# Patient Record
Sex: Male | Born: 1970
Health system: Southern US, Community
[De-identification: ages and names within clinical notes are randomized; demographics above are authoritative.]

## PROBLEM LIST (undated history)

## (undated) DIAGNOSIS — M25559 Pain in unspecified hip: Secondary | ICD-10-CM

---

## 2005-07-03 ENCOUNTER — Emergency Department (HOSPITAL_COMMUNITY): Admission: EM | Admit: 2005-07-03 | Discharge: 2005-07-03 | Payer: Self-pay | Admitting: *Deleted

## 2006-10-08 ENCOUNTER — Ambulatory Visit: Payer: Self-pay | Admitting: Family Medicine

## 2007-05-09 ENCOUNTER — Emergency Department (HOSPITAL_COMMUNITY): Admission: EM | Admit: 2007-05-09 | Discharge: 2007-05-09 | Payer: Self-pay | Admitting: Emergency Medicine

## 2010-11-21 ENCOUNTER — Emergency Department (HOSPITAL_COMMUNITY)
Admission: EM | Admit: 2010-11-21 | Discharge: 2010-11-21 | Disposition: A | Discharge status: Home / Self Care | Payer: Self-pay | Attending: Emergency Medicine | Admitting: Emergency Medicine

## 2010-11-21 DIAGNOSIS — IMO0002 Reserved for concepts with insufficient information to code with codable children: Secondary | ICD-10-CM | POA: Insufficient documentation

## 2011-10-30 ENCOUNTER — Emergency Department (HOSPITAL_COMMUNITY)
Admission: EM | Admit: 2011-10-30 | Discharge: 2011-10-30 | Disposition: A | Discharge status: Home / Self Care | Payer: Self-pay | Attending: Emergency Medicine | Admitting: Emergency Medicine

## 2011-10-30 ENCOUNTER — Emergency Department (HOSPITAL_COMMUNITY): Payer: Self-pay

## 2011-10-30 ENCOUNTER — Encounter (HOSPITAL_COMMUNITY): Payer: Self-pay | Admitting: *Deleted

## 2011-10-30 DIAGNOSIS — R091 Pleurisy: Secondary | ICD-10-CM | POA: Insufficient documentation

## 2011-10-30 DIAGNOSIS — R0602 Shortness of breath: Secondary | ICD-10-CM | POA: Insufficient documentation

## 2011-10-30 DIAGNOSIS — R079 Chest pain, unspecified: Secondary | ICD-10-CM | POA: Insufficient documentation

## 2011-10-30 LAB — COMPREHENSIVE METABOLIC PANEL
ALT: 15 U/L (ref 0–53)
AST: 18 U/L (ref 0–37)
Albumin: 4.1 g/dL (ref 3.5–5.2)
CO2: 22 mEq/L (ref 19–32)
Calcium: 9.7 mg/dL (ref 8.4–10.5)
Creatinine, Ser: 0.93 mg/dL (ref 0.50–1.35)
Sodium: 135 mEq/L (ref 135–145)
Total Protein: 8 g/dL (ref 6.0–8.3)

## 2011-10-30 LAB — CBC WITH DIFFERENTIAL/PLATELET
Basophils Absolute: 0 10*3/uL (ref 0.0–0.1)
Basophils Relative: 0 % (ref 0–1)
Eosinophils Absolute: 0.1 10*3/uL (ref 0.0–0.7)
Eosinophils Relative: 1 % (ref 0–5)
Lymphocytes Relative: 26 % (ref 12–46)
MCHC: 35 g/dL (ref 30.0–36.0)
MCV: 82.1 fL (ref 78.0–100.0)
Platelets: 346 10*3/uL (ref 150–400)
RDW: 12.9 % (ref 11.5–15.5)
WBC: 8.2 10*3/uL (ref 4.0–10.5)

## 2011-10-30 LAB — URINALYSIS, ROUTINE W REFLEX MICROSCOPIC
Nitrite: NEGATIVE
Protein, ur: NEGATIVE mg/dL
Specific Gravity, Urine: 1.029 (ref 1.005–1.030)
Urobilinogen, UA: 1 mg/dL (ref 0.0–1.0)

## 2011-10-30 LAB — CARDIAC PANEL(CRET KIN+CKTOT+MB+TROPI)
CK, MB: 1.5 ng/mL (ref 0.3–4.0)
Relative Index: INVALID (ref 0.0–2.5)
Troponin I: <0.3 ng/mL (ref <0.30)

## 2011-10-30 LAB — URINE MICROSCOPIC-ADD ON

## 2011-10-30 MED ORDER — LORAZEPAM 1 MG PO TABS
1.0000 mg | ORAL_TABLET | Freq: Once | ORAL | Status: AC
  Administered 2011-10-30: 1 mg via ORAL
  Filled 2011-10-30: qty 1

## 2011-10-30 MED ORDER — OXYCODONE-ACETAMINOPHEN 5-325 MG PO TABS
2.0000 | ORAL_TABLET | Freq: Once | ORAL | Status: AC
  Administered 2011-10-30: 2 via ORAL

## 2011-10-30 MED ORDER — HYDROMORPHONE HCL PF 1 MG/ML IJ SOLN
1.0000 mg | Freq: Once | INTRAMUSCULAR | Status: AC
  Administered 2011-10-30: 1 mg via INTRAVENOUS
  Filled 2011-10-30: qty 1

## 2011-10-30 MED ORDER — OXYCODONE-ACETAMINOPHEN 5-325 MG PO TABS
ORAL_TABLET | ORAL | Status: AC
  Filled 2011-10-30: qty 2

## 2011-10-30 MED ORDER — IBUPROFEN 800 MG PO TABS: 800.0000 mg | ORAL_TABLET | Freq: Three times a day (TID) | ORAL | Status: AC

## 2011-10-30 MED ORDER — ONDANSETRON HCL 4 MG/2ML IJ SOLN
4.0000 mg | Freq: Once | INTRAMUSCULAR | Status: AC
  Administered 2011-10-30: 4 mg via INTRAVENOUS
  Filled 2011-10-30: qty 2

## 2011-10-30 MED ORDER — IOHEXOL 350 MG/ML SOLN
80.0000 mL | Freq: Once | INTRAVENOUS | Status: AC | PRN
  Administered 2011-10-30: 80 mL via INTRAVENOUS

## 2011-10-30 MED ORDER — SODIUM CHLORIDE 0.9 % IV SOLN
Freq: Once | INTRAVENOUS | Status: AC
  Administered 2011-10-30: 16:00:00 via INTRAVENOUS

## 2011-10-30 MED ORDER — HYDROCODONE-ACETAMINOPHEN 5-325 MG PO TABS: 2.0000 | ORAL_TABLET | ORAL | Status: AC | PRN

## 2011-10-30 NOTE — ED Notes (Signed)
Pt. Is unable to use the restroom at time, but there is a urinal at the bedside.

## 2011-10-30 NOTE — ED Notes (Signed)
One pt belonging bag locked in room #4 reg ED

## 2011-10-30 NOTE — ED Notes (Signed)
Pt states "been having pain in my left side for the past 8-9 years, but yesterday the pain got worse and has not gone aay, is also hurting in my back, hurts worth with breathing"

## 2011-10-30 NOTE — ED Notes (Signed)
Pt to ct 

## 2011-10-30 NOTE — ED Provider Notes (Signed)
History     CSN: [id number]  Arrival date & time 10/30/11  1450   First MD Initiated Contact with Patient 10/30/11 1533      Chief Complaint  Patient presents with  . Chest Pain    left rib pain    (Consider location/radiation/quality/duration/timing/severity/associated sxs/prior treatment) HPI Comments: Patient's intermittent left-sided rib pain for the past several years much worse in the past 2 days has been constant and worse with deep breathing. And some chest raise the left side and to his left mid back. Nothing makes it better breathing makes it worse. Denies any cough, fever, chills, abdominal pain nausea vomiting. No history of blood clots. No leg pain or swelling.  Associated with SOB.  The history is provided by the patient.    History reviewed. No pertinent past medical history.  History reviewed. No pertinent past surgical history.  History reviewed. No pertinent family history.  History  Substance Use Topics  . Smoking status: Never Smoker   . Smokeless tobacco: Not on file  . Alcohol Use: Yes     ocassionally      Review of Systems  Constitutional: Negative for fever, activity change and appetite change.  HENT: Negative for congestion and rhinorrhea.   Respiratory: Positive for shortness of breath. Negative for cough.   Cardiovascular: Positive for chest pain.  Gastrointestinal: Negative for nausea, vomiting and abdominal pain.  Genitourinary: Negative for dysuria and hematuria.  Musculoskeletal: Negative for back pain.  Skin: Negative for rash.  Neurological: Negative for dizziness and headaches.    Allergies  Penicillins  Home Medications  No current outpatient prescriptions on file.  BP 100/67  Pulse 67  Temp 98.5 F (36.9 C) (Oral)  Resp 16  Wt 167 lb (75.751 kg)  SpO2 100%  Physical Exam  Constitutional: He is oriented to person, place, and time. He appears well-developed and well-nourished. He appears distressed.       uncomfortable   HENT:  Head: Normocephalic and atraumatic.  Mouth/Throat: Oropharynx is clear and moist. No oropharyngeal exudate.  Eyes: Conjunctivae and EOM are normal. Pupils are equal, round, and reactive to light.  Neck: Normal range of motion. Neck supple.  Cardiovascular: Normal rate, regular rhythm and normal heart sounds.   No murmur heard. Pulmonary/Chest: Effort normal and breath sounds normal. No respiratory distress. He exhibits tenderness.       TTP left sided ribs.  Abdominal: Soft. There is no tenderness. There is no rebound and no guarding.  Musculoskeletal: Normal range of motion. He exhibits tenderness. He exhibits no edema.       TTP paraspinal thoracic pain. No midline back pain.  Neurological: He is alert and oriented to person, place, and time. No cranial nerve deficit.  Skin: Skin is warm.    ED Course  Procedures (including critical care time)  Labs Reviewed  CBC WITH DIFFERENTIAL - Abnormal; Notable for the following:    HCT 38.9 (*)     All other components within normal limits  URINALYSIS, ROUTINE W REFLEX MICROSCOPIC - Abnormal; Notable for the following:    Glucose, UA 100 (*)     Leukocytes, UA TRACE (*)     All other components within normal limits  COMPREHENSIVE METABOLIC PANEL  CARDIAC PANEL(CRET KIN+CKTOT+MB+TROPI)  URINE MICROSCOPIC-ADD ON   Dg Chest 2 View  10/30/2011  *RADIOLOGY REPORT*  Clinical Data: Left chest pain radiating back.  Shortness of breath.  CHEST - 2 VIEW  Comparison: None.  Findings: Heart size and mediastinal  contours are normal.  Both lungs are clear.  No evidence of pneumothorax or pleural effusion. No mass or lymphadenopathy identified.  IMPRESSION: No active disease.  Original Report Authenticated By: Danae Orleans, M.D.   Ct Angio Chest W/cm &/or Wo Cm  10/30/2011  *RADIOLOGY REPORT*  Clinical Data: Chest pain, short of breath, concern pulmonary embolism  CT ANGIOGRAPHY CHEST  Technique:  Multidetector CT imaging of the chest using the  standard protocol during bolus administration of intravenous contrast. Multiplanar reconstructed images including MIPs were obtained and reviewed to evaluate the vascular anatomy.  Contrast: 80mL OMNIPAQUE IOHEXOL 350 MG/ML SOLN  Comparison: None.  Findings: There is no filling defects within the pulmonary arteries to suggest acute pulmonary embolism.  No acute findings of the aorta or great vessels.  No pericardial fluid.  Esophagus is normal.  No mediastinal lymphadenopathy.  Review of the lung windows demonstrate no pleural fluid or pneumothorax.  No pulmonary edema.  Airways are normal.  There is a small dependent left pleural effusion.  Limited view of the upper abdomen is unremarkable.  Limited view of the skeleton is unremarkable.  IMPRESSION:  1.  No evidence of acute pulmonary embolism. 2.  Small left effusion.  Original Report Authenticated By: Genevive Bi, M.D.     No diagnosis found.    MDM  Pleuritic left-sided chest pain for the past several years but worse in the past few days. Worse with deep breathing. No anterior chest pain, cough or fever.  X-ray negative. CT negative for pulmonary embolism. Bedside ultrasound negative for pericardial effusion. Patient does have a maculopapular rash on his torso but he's had for several months but no evidence of herpetic vesicles.  No desaturation with ambulation. We'll treat for pleurisy. Return precautions discussed including development of herpetic rash   Date: 10/30/2011  Rate: 55  Rhythm: normal sinus rhythm  QRS Axis: normal  Intervals: normal  ST/T Wave abnormalities: normal  Conduction Disutrbances:none  Narrative Interpretation:   Old EKG Reviewed: none available   EMERGENCY DEPARTMENT [place] CARDIAC EXAM "Study: Limited Ultrasound of the heart and pericardium"  INDICATIONS:Dyspnea Multiple views of the heart and pericardium are obtained with a multi-frequency probe.  PERFORMED [id number]  IMAGES ARCHIVED?:  No  FINDINGS: No pericardial effusion, Normal contractility and Tamponade physiology absent  LIMITATIONS:  Beside techniqu  VIEWS USED: Subcostal 4 chamber and Apical 4 chamber   INTERPRETATION: Cardiac activity present, Pericardial effusioin absent, Cardiac tamponade absent and Normal contractility      Glynn Octave, MD 10/30/11 1939

## 2011-11-05 ENCOUNTER — Ambulatory Visit: Payer: Self-pay | Admitting: Medical

## 2012-09-29 ENCOUNTER — Encounter (HOSPITAL_COMMUNITY): Payer: Self-pay | Admitting: Family Medicine

## 2012-09-29 ENCOUNTER — Emergency Department (HOSPITAL_COMMUNITY): Payer: Self-pay

## 2012-09-29 ENCOUNTER — Emergency Department (HOSPITAL_COMMUNITY)
Admission: EM | Admit: 2012-09-29 | Discharge: 2012-09-29 | Disposition: A | Discharge status: Home / Self Care | Payer: Self-pay | Attending: Emergency Medicine | Admitting: Emergency Medicine

## 2012-09-29 DIAGNOSIS — M542 Cervicalgia: Secondary | ICD-10-CM

## 2012-09-29 DIAGNOSIS — Y929 Unspecified place or not applicable: Secondary | ICD-10-CM | POA: Insufficient documentation

## 2012-09-29 DIAGNOSIS — Y9389 Activity, other specified: Secondary | ICD-10-CM | POA: Insufficient documentation

## 2012-09-29 DIAGNOSIS — S199XXA Unspecified injury of neck, initial encounter: Secondary | ICD-10-CM | POA: Insufficient documentation

## 2012-09-29 DIAGNOSIS — S0993XA Unspecified injury of face, initial encounter: Secondary | ICD-10-CM | POA: Insufficient documentation

## 2012-09-29 DIAGNOSIS — Z880 Allergy status to penicillin: Secondary | ICD-10-CM | POA: Insufficient documentation

## 2012-09-29 DIAGNOSIS — R369 Urethral discharge, unspecified: Secondary | ICD-10-CM | POA: Insufficient documentation

## 2012-09-29 DIAGNOSIS — N489 Disorder of penis, unspecified: Secondary | ICD-10-CM | POA: Insufficient documentation

## 2012-09-29 DIAGNOSIS — X503XXA Overexertion from repetitive movements, initial encounter: Secondary | ICD-10-CM | POA: Insufficient documentation

## 2012-09-29 DIAGNOSIS — Z87828 Personal history of other (healed) physical injury and trauma: Secondary | ICD-10-CM | POA: Insufficient documentation

## 2012-09-29 MED ORDER — CYCLOBENZAPRINE HCL 10 MG PO TABS: ORAL_TABLET | ORAL | Status: DC

## 2012-09-29 MED ORDER — HYDROCODONE-ACETAMINOPHEN 5-325 MG PO TABS
1.0000 | ORAL_TABLET | Freq: Once | ORAL | Status: AC
  Administered 2012-09-29: 1 via ORAL
  Filled 2012-09-29: qty 1

## 2012-09-29 MED ORDER — IBUPROFEN 800 MG PO TABS: 800.0000 mg | ORAL_TABLET | Freq: Three times a day (TID) | ORAL | Status: DC

## 2012-09-29 NOTE — ED Notes (Signed)
sts last night he was getting off the couch and heard a pop in his neck and now pain and pressure in neck. sts hurts in the middle of neck.

## 2012-09-29 NOTE — ED Notes (Signed)
Cervical collar applied in triage.
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
## 2012-10-02 NOTE — ED Provider Notes (Signed)
History/physical exam/procedure(s) were performed by non-physician practitioner and as supervising physician I was immediately available for consultation/collaboration. I have reviewed all notes and am in agreement with care and plan.   Hilario Quarry, MD 10/02/12 1755

## 2012-10-19 ENCOUNTER — Encounter (HOSPITAL_COMMUNITY): Payer: Self-pay | Admitting: Emergency Medicine

## 2012-10-19 ENCOUNTER — Emergency Department (HOSPITAL_COMMUNITY)
Admission: EM | Admit: 2012-10-19 | Discharge: 2012-10-19 | Disposition: A | Discharge status: Home / Self Care | Payer: Self-pay | Attending: Emergency Medicine | Admitting: Emergency Medicine

## 2012-10-19 DIAGNOSIS — IMO0001 Reserved for inherently not codable concepts without codable children: Secondary | ICD-10-CM | POA: Insufficient documentation

## 2012-10-19 DIAGNOSIS — Z880 Allergy status to penicillin: Secondary | ICD-10-CM | POA: Insufficient documentation

## 2012-10-19 DIAGNOSIS — M79609 Pain in unspecified limb: Secondary | ICD-10-CM | POA: Insufficient documentation

## 2012-10-19 DIAGNOSIS — R6883 Chills (without fever): Secondary | ICD-10-CM | POA: Insufficient documentation

## 2012-10-19 DIAGNOSIS — M79601 Pain in right arm: Secondary | ICD-10-CM

## 2012-10-19 MED ORDER — METHOCARBAMOL 500 MG PO TABS
500.0000 mg | ORAL_TABLET | Freq: Once | ORAL | Status: AC
  Administered 2012-10-19: 500 mg via ORAL
  Filled 2012-10-19: qty 1

## 2012-10-19 MED ORDER — PREDNISONE 20 MG PO TABS: ORAL_TABLET | ORAL | Status: DC

## 2012-10-19 MED ORDER — HYDROCODONE-ACETAMINOPHEN 5-325 MG PO TABS
2.0000 | ORAL_TABLET | Freq: Once | ORAL | Status: AC
  Administered 2012-10-19: 2 via ORAL
  Filled 2012-10-19: qty 2

## 2012-10-19 MED ORDER — ONDANSETRON 8 MG PO TBDP
8.0000 mg | ORAL_TABLET | Freq: Once | ORAL | Status: AC
  Administered 2012-10-19: 8 mg via ORAL
  Filled 2012-10-19: qty 1

## 2012-10-19 MED ORDER — PROMETHAZINE HCL 25 MG PO TABS: 25.0000 mg | ORAL_TABLET | Freq: Four times a day (QID) | ORAL | Status: DC | PRN

## 2012-10-19 MED ORDER — HYDROCODONE-ACETAMINOPHEN 5-325 MG PO TABS: 2.0000 | ORAL_TABLET | ORAL | Status: DC | PRN

## 2012-10-19 NOTE — ED Notes (Addendum)
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 Pt has mild swelling to R forearm. Pt denies injury Pt is A&O and in NAD

## 2012-10-19 NOTE — ED Provider Notes (Signed)
History    This chart was scribed for non-physician practitioner, Mora Bellman, PA-C, working with Hilario Quarry, MD by Melba Coon, ED Scribe. This patient was seen in room WTR5/WTR5 and the patient's care was started at 4:53PM.  CSN: [id number] Arrival date & time 10/19/12  1555  None    Chief Complaint  Patient presents with  . Arm Pain   (Consider location/radiation/quality/duration/timing/severity/associated sxs/prior Treatment) The history is provided by the patient. No language interpreter was used.   HPI Comments: Jeffrey Vasquez is a 42 y.o. male who presents to the Emergency Department complaining of constant, moderate to severe, stabbing, shooting, right forearm pain with a gradual onset yesterday. 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 He feels he may have overworked his arm. He reports today at work, he could not grab things due to the pain and reports that the pain has gotten progressively worse. Moving and twisting the arm aggravates the pain. He also reports a "cracking" sensation in his arm. Goody powder has not alleviated the pain. He denies taking any other pain medications. No known allergies to pain medications. No other pertinent medical symptoms.  History reviewed. No pertinent past medical history. History reviewed. No pertinent past surgical history. No family history on file. History  Substance Use Topics  . Smoking status: Never Smoker   . Smokeless tobacco: Not on file  . Alcohol Use: Yes     Comment: ocassionally    Review of Systems  Constitutional: Positive for chills. Negative for fever, appetite change and fatigue.  HENT: Negative for congestion, sinus pressure and ear discharge.   Eyes: Negative for discharge.  Respiratory: Negative for cough.   Cardiovascular: Negative for chest pain.  Gastrointestinal: Negative for nausea, vomiting, abdominal pain and diarrhea.  Genitourinary: Negative for  frequency and hematuria.  Musculoskeletal: Positive for myalgias. Negative for back pain.  Skin: Negative for rash.  Neurological: Negative for seizures and headaches.  Psychiatric/Behavioral: Negative for hallucinations.  All other systems reviewed and are negative.    Allergies  Penicillins  Home Medications   Current Outpatient Rx  Name  Route  Sig  Dispense  Refill  . ibuprofen (ADVIL,MOTRIN) 800 MG tablet   Oral   Take 1 tablet (800 mg total) by mouth 3 (three) times daily.   21 tablet   0    BP 107/74  Pulse 72  Temp(Src) 97.7 F (36.5 C) (Oral)  SpO2 100% Physical Exam  Nursing note and vitals reviewed. Constitutional: He is oriented to person, place, and time. He appears well-developed and well-nourished. No distress.  HENT:  Head: Normocephalic and atraumatic.  Right Ear: External ear normal.  Left Ear: External ear normal.  Nose: Nose normal.  Eyes: Conjunctivae are normal.  Neck: Normal range of motion. No tracheal deviation present.  Cardiovascular: Normal rate, regular rhythm, normal heart sounds and intact distal pulses.   Pulmonary/Chest: Effort normal and breath sounds normal. No stridor.  Abdominal: Soft. He exhibits no distension. There is no tenderness.  Musculoskeletal: Normal range of motion. He exhibits tenderness.  TTP posterolateral aspect of the right distal forearm with limited ROM secondary to pain. Compartment is soft.  Neurological: He is alert and oriented to person, place, and time.  Skin: Skin is warm and dry. He is not diaphoretic.  CR < 3 seconds. Has multiple tattoos.  Psychiatric: He has a normal mood and affect. His behavior is normal.    ED Course  Procedures (including critical care time)  COORDINATION OF CARE:  4:58PM - Pain medication and muscle relaxers will be ordered for [employer]. Thumb spica splint will be given. Work note will be given.   Labs Reviewed - No data to display  No results found.  1. Arm pain,  right     MDM  Patient presents with an overuse injury - no trauma. ROM limited due to pain. Compartment soft, neurovascularly intact. He was given a brace for comfort. Work note given. Rest, ice, compression, NSAIDs. Small rx for norco and prednisone taper. Ortho follow up if no improvement in symptoms. Return instructions given. Vital signs stable for discharge. Patient / Family / Caregiver informed of clinical course, understand medical decision-making process, and agree with plan.   I personally performed the services described in this documentation, which was scribed in my presence. The recorded information has been reviewed and is accurate.     Mora Bellman, PA-C 10/19/12 1754

## 2012-10-20 NOTE — ED Provider Notes (Signed)
History/physical exam/procedure(s) were performed by non-physician practitioner and as supervising physician I was immediately available for consultation/collaboration. I have reviewed all notes and am in agreement with care and plan.   Hilario Quarry, MD 10/20/12 [phone]

## 2012-12-29 ENCOUNTER — Encounter (HOSPITAL_COMMUNITY): Payer: Self-pay | Admitting: Emergency Medicine

## 2012-12-29 ENCOUNTER — Emergency Department (HOSPITAL_COMMUNITY)
Admission: EM | Admit: 2012-12-29 | Discharge: 2012-12-29 | Disposition: A | Discharge status: Home / Self Care | Payer: Self-pay | Attending: Emergency Medicine | Admitting: Emergency Medicine

## 2012-12-29 DIAGNOSIS — L02412 Cutaneous abscess of left axilla: Secondary | ICD-10-CM

## 2012-12-29 DIAGNOSIS — Z880 Allergy status to penicillin: Secondary | ICD-10-CM | POA: Insufficient documentation

## 2012-12-29 DIAGNOSIS — IMO0002 Reserved for concepts with insufficient information to code with codable children: Secondary | ICD-10-CM | POA: Insufficient documentation

## 2012-12-29 MED ORDER — SULFAMETHOXAZOLE-TRIMETHOPRIM 800-160 MG PO TABS: 1.0000 | ORAL_TABLET | Freq: Two times a day (BID) | ORAL | Status: DC

## 2012-12-29 MED ORDER — HYDROCODONE-ACETAMINOPHEN 5-325 MG PO TABS
1.0000 | ORAL_TABLET | Freq: Once | ORAL | Status: AC
  Administered 2012-12-29: 1 via ORAL
  Filled 2012-12-29: qty 1

## 2012-12-29 MED ORDER — HYDROCODONE-ACETAMINOPHEN 5-325 MG PO TABS: 1.0000 | ORAL_TABLET | ORAL | Status: DC | PRN

## 2012-12-29 NOTE — ED Notes (Signed)
Pt reports an abscess in the left axilla. Pt reports similar problem one year ago. Pt is A/O x4 and in NAD.

## 2012-12-29 NOTE — ED Notes (Signed)
Pt has a ride home.  

## 2012-12-29 NOTE — ED Provider Notes (Signed)
CSN: [id number]     Arrival date & time 12/29/12  1849 History   This chart was scribed for non-physician practitioner Ivonne Andrew, PA-C, working with Nelia Shi, MD by Dorothey Baseman, ED Scribe. This patient was seen in room WTR9/WTR9 and the patient's care was started at 8:11 PM.    Chief Complaint  Patient presents with  . Abscess    The history is provided by the patient. No language interpreter was used.   HPI Comments: Jeffrey Vasquez is a 42 y.o. male who presents to the Emergency Department complaining of an abscess to his left axilla onset 3 days ago with associated redness, swelling, and pain that is exacerbated with movement of his left arm. He states he was treated for an abscess in the same location previously, but that it has returned. Patient states he has not tried any treatments at home. He denies any fever, chills, or other symptoms. He denies any pertinent medical history or medication use.   History reviewed. No pertinent past medical history. History reviewed. No pertinent past surgical history. No family history on file. History  Substance Use Topics  . Smoking status: Never Smoker   . Smokeless tobacco: Not on file  . Alcohol Use: Yes     Comment: ocassionally    Review of Systems  Constitutional: Negative for fever and chills.  All other systems reviewed and are negative.    Allergies  Penicillins  Home Medications  No current outpatient prescriptions on file.  Triage Vitals: BP 113/71  Pulse 78  Temp(Src) 98.2 F (36.8 C) (Oral)  Resp 18  SpO2 99%  Physical Exam  Nursing note and vitals reviewed. Constitutional: He is oriented to person, place, and time. He appears well-developed and well-nourished. No distress.  HENT:  Head: Normocephalic and atraumatic.  Eyes: Conjunctivae are normal.  Neck: Normal range of motion. Neck supple.  Cardiovascular: Normal rate, regular rhythm and normal heart sounds.   Pulmonary/Chest: Effort normal and breath  sounds normal.  Musculoskeletal: Normal range of motion.  Neurological: He is alert and oriented to person, place, and time.  Skin: Skin is warm and dry. There is erythema.  2 cm irregular nodule to the left axilla with mild, surrounding erythema and tenderness.  Psychiatric: He has a normal mood and affect. His behavior is normal.    ED Course  Procedures Medications  HYDROcodone-acetaminophen (NORCO/VICODIN) 5-325 MG per tablet 1 tablet (1 tablet Oral Given 12/29/12 2035)    DIAGNOSTIC STUDIES: Oxygen Saturation is 99% on room air, normal by my interpretation.    COORDINATION OF CARE: 8:15PM- Discussed that the nodule is due to a bacterial infection and will discharge the patient with Septra to take every 12 hours. Will discharge patient with Norco to take every 4 hours or PRN for pain management. Will perform an incision and drainage of the abscess. Advised patient to apply warm compresses if there are any future episodes. Discussed treatment plan with patient at bedside and patient verbalized agreement.   INCISION AND DRAINAGE Performed by: Ivonne Andrew, PA-C Consent: Verbal consent obtained. Risks and benefits: risks, benefits and alternatives were discussed Type: abscess Body area: left axilla Incision was made with a scalpel. Local anesthetic: lidocaine 2% without epinephrine Anesthetic total: 2 ml Complexity: simple Blunt dissection to break up loculations Drainage: purulent Drainage amount: moderate Patient tolerance: Patient tolerated the procedure well with no immediate complications.     MDM   1. Abscess of left axilla  8:15 PM patient seen and evaluated. Patient appears well in no acute distress. Does not appear severely ill or toxic. Simple abscess of left axilla. I&D performed. Home Care instructions provided.    I personally performed the services described in this documentation, which was scribed in my presence. The recorded information has been  reviewed and is accurate.      Angus Seller, PA-C 12/29/12 2105

## 2012-12-30 NOTE — ED Provider Notes (Signed)
Medical screening examination/treatment/procedure(s) were performed by non-physician practitioner and as supervising physician I was immediately available for consultation/collaboration.    Jeffrey Bodkin L Hosea Han[place]alt, MD 12/30/12 1502 

## 2013-08-10 ENCOUNTER — Encounter (HOSPITAL_COMMUNITY): Payer: Self-pay | Admitting: Emergency Medicine

## 2013-08-10 ENCOUNTER — Emergency Department (HOSPITAL_COMMUNITY)
Admission: EM | Admit: 2013-08-10 | Discharge: 2013-08-11 | Disposition: A | Discharge status: Home / Self Care | Payer: Self-pay | Attending: Emergency Medicine | Admitting: Emergency Medicine

## 2013-08-10 DIAGNOSIS — Q6589 Other specified congenital deformities of hip: Secondary | ICD-10-CM | POA: Insufficient documentation

## 2013-08-10 DIAGNOSIS — M1631 Unilateral osteoarthritis resulting from hip dysplasia, right hip: Secondary | ICD-10-CM

## 2013-08-10 DIAGNOSIS — M1990 Unspecified osteoarthritis, unspecified site: Secondary | ICD-10-CM | POA: Insufficient documentation

## 2013-08-10 DIAGNOSIS — M549 Dorsalgia, unspecified: Secondary | ICD-10-CM | POA: Insufficient documentation

## 2013-08-10 DIAGNOSIS — Z880 Allergy status to penicillin: Secondary | ICD-10-CM | POA: Insufficient documentation

## 2013-08-10 NOTE — ED Notes (Signed)
Pt states he has been having intermittent groin pain for years that has gotten worse tonight.  Pt states it hurts to walk and is more in his hip flexor area than genitals

## 2013-08-11 ENCOUNTER — Emergency Department (HOSPITAL_COMMUNITY): Payer: Self-pay

## 2013-08-11 MED ORDER — MELOXICAM 7.5 MG PO TABS: 15.0000 mg | ORAL_TABLET | Freq: Every day | ORAL | Status: DC

## 2013-08-11 MED ORDER — OXYCODONE-ACETAMINOPHEN 5-325 MG PO TABS: 1.0000 | ORAL_TABLET | Freq: Four times a day (QID) | ORAL | Status: DC | PRN

## 2013-08-11 MED ORDER — GUAIFENESIN 100 MG/5ML PO SYRP: 100.0000 mg | ORAL_SOLUTION | ORAL | Status: DC | PRN

## 2013-08-11 MED ORDER — OXYCODONE-ACETAMINOPHEN 5-325 MG PO TABS
2.0000 | ORAL_TABLET | Freq: Once | ORAL | Status: AC
  Administered 2013-08-11: 2 via ORAL
  Filled 2013-08-11: qty 2

## 2013-08-11 MED ORDER — METHOCARBAMOL 500 MG PO TABS: 500.0000 mg | ORAL_TABLET | Freq: Two times a day (BID) | ORAL | Status: DC | PRN

## 2013-08-11 NOTE — ED Provider Notes (Signed)
CSN: [id number]     Arrival date & time 08/10/13  2233 History  This chart was scribed for non-physician practitioner Junius FinnerErin O'Malley, PA-C working with Rolland PorterMark Pradeep, MD by Joaquin MusicKristina Sanchez-Matthews, ED Scribe. This patient was seen in room TR08C/TR08C and the patient's care was started at 12:02 AM .   Chief Complaint  Patient presents with  . Groin Pain   The history is provided by the patient. No language interpreter was used.   HPI Comments: Jeffrey Vasquez is a 43 y.o. male who presents to the Emergency Department complaining of intermittent groin pain that radiates to R hip that began "a while ago" but has worsened today. Pt states he is unable to walk due to his pain and describes the pain as sharp and constant, 8/10, worse with walking and certain movements including laying on his R side. He states when he feels constipated, he has an increased amount of gas and is unsure if this may be the cause of his pain. He states he has taken a Percocet "the other day" but denies any relief. 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 He denies back pain, swelling to testicle, dysuria, and recent traumas.  History reviewed. No pertinent past medical history. History reviewed. No pertinent past surgical history. No family history on file. History  Substance Use Topics  . Smoking status: Never Smoker   . Smokeless tobacco: Not on file  . Alcohol Use: Yes     Comment: ocassionally    Review of Systems  Genitourinary: Negative for dysuria, flank pain, scrotal swelling and testicular pain.  Musculoskeletal: Positive for back pain and myalgias.  Skin: Negative for wound.   Allergies  Penicillins  Home Medications   Prior to Admission medications   Not on File   BP 113/88  Pulse 66  Temp(Src) 98.2 F (36.8 C) (Oral)  Resp 18  Ht 5\' 9"  (1.753 m)  Wt 155 lb (70.308 kg)  BMI 22.88 kg/m2  SpO2 98%  Physical Exam  Nursing note and vitals reviewed. Constitutional: He is  oriented to person, place, and time. He appears well-developed and well-nourished.  HENT:  Head: Normocephalic and atraumatic.  Eyes: EOM are normal.  Neck: Normal range of motion.  Cardiovascular: Normal rate.   Pulmonary/Chest: Effort normal.  Genitourinary:  Chaperoned exam. Normal penis and scrotum. No testicular swelling or mass. No hernia palpated.  Musculoskeletal: Normal range of motion.  Tenderness along R upper lateral thigh and R groin. No mass palpated. No erythema, ecchymosis, or warmth. Antalgic gait. Worse with full hip flexion and extension. Pedal pulse 2+.  Neurological: He is alert and oriented to person, place, and time.  Skin: Skin is warm and dry.  Psychiatric: He has a normal mood and affect. His behavior is normal.   ED Course  Procedures  DIAGNOSTIC STUDIES: Oxygen Saturation is 97% on RA, normal by my interpretation.    COORDINATION OF CARE: 12:05 AM-Discussed treatment plan which includes X-ray and will prescribe pain medication while in ED. Pt agreed to plan.   Examination chaperoned by Anabel BeneKristina A Sanchez-Matthews.  Labs Review Labs Reviewed - No data to display  Imaging Review Dg Hip Complete Right  08/11/2013   CLINICAL DATA:  Right hip pain  EXAM: RIGHT HIP - COMPLETE 2+ VIEW  COMPARISON:  None.  FINDINGS: There is deformity of the right hip and proximal femur compatible with hip dysplasia. No acute fracture. No dislocation.  IMPRESSION: No acute bony pathology.  Right hip  dysplasia.   Electronically Signed   By: Maryclare BeanArt  Hoss M.D.   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 Denies trauma. No evidence of hernia.  Right leg is neurovascularly in tact.  Plain films: significant for right hip dysplasia.  Discussed pt with Dr. Fayrene FearingJames. [facility]  [facility] and treatment as he may require hip replacement later in life. Return precautions provided. Pt verbalized understanding and agreement with tx plan.   I personally performed the services described in this documentation, which was scribed in my presence. The recorded information has been reviewed and is accurate.    Junius FinnerErin O'Malley, PA-C 08/12/13 [phone]

## 2013-08-11 NOTE — Discharge Instructions (Signed)
Osteoarthritis Osteoarthritis is a disease that causes soreness and swelling (inflammation) of a joint. It occurs when the cartilage at the affected joint wears down. Cartilage acts as a cushion, covering the ends of bones where they meet to form a joint. Osteoarthritis is the most common form of arthritis. It often occurs in older people. The joints affected most often by this condition include those in the:  Ends of the fingers.  Thumbs.  Neck.  Lower back.  Knees.  Hips. CAUSES  Over time, the cartilage that covers the ends of bones begins to wear away. This causes bone to rub on bone, producing pain and stiffness in the affected joints.  RISK FACTORS Certain factors can increase your chances of having osteoarthritis, including:  Older age.  Excessive body weight.  Overuse of joints. SIGNS AND SYMPTOMS   Pain, swelling, and stiffness in the joint.  Over time, the joint may lose its normal shape.  Small deposits of bone (osteophytes) may grow on the edges of the joint.  Bits of bone or cartilage can break off and float inside the joint space. This may cause more pain and damage. DIAGNOSIS  Your health care provider will do a physical exam and ask about your symptoms. Various tests may be ordered, such as:  X-rays of the affected joint.  An MRI scan.  Blood tests to rule out other types of arthritis.  Joint fluid tests. This involves using a needle to draw fluid from the joint and examining the fluid under a microscope. TREATMENT  Goals of treatment are to control pain and improve joint function. Treatment plans may include:  A prescribed exercise program that allows for rest and joint relief.  A weight control plan.  Pain relief techniques, such as:  Properly applied heat and cold.  Electric pulses delivered to nerve endings under the skin (transcutaneous electrical nerve stimulation, TENS).  Massage.  Certain nutritional supplements.  Medicines to  control pain, such as:  Acetaminophen.  Nonsteroidal anti-inflammatory drugs (NSAIDs), such as naproxen.  Narcotic or central-acting agents, such as tramadol.  Corticosteroids. These can be given orally or as an injection.  Surgery to reposition the bones and relieve pain (osteotomy) or to remove loose pieces of bone and cartilage. Joint replacement may be needed in advanced states of osteoarthritis. HOME CARE INSTRUCTIONS   Only take over-the-counter or prescription medicines as directed by your health care provider. Take all medicines exactly as instructed.  Maintain a healthy weight. Follow your health care provider's instructions for weight control. This may include dietary instructions.  Exercise as directed. Your health care provider can recommend specific types of exercise. These may include:  Strengthening exercises These are done to strengthen the muscles that support joints affected by arthritis. They can be performed with weights or with exercise bands to add resistance.  Aerobic activities These are exercises, such as brisk walking or low-impact aerobics, that get your heart pumping.  Range-of-motion activities These keep your joints limber.  Balance and agility exercises These help you maintain daily living skills.  Rest your affected joints as directed by your health care provider.  Follow up with your health care provider as directed. SEEK MEDICAL CARE IF:   Your skin turns red.  You develop a rash in addition to your joint pain.  You have worsening joint pain. SEEK IMMEDIATE MEDICAL CARE IF:  You have a significant loss of weight or appetite.  You have a fever along with joint or muscle aches.  You have  night sweats. FOR MORE INFORMATION  National Institute of Arthritis and Musculoskeletal and Skin Diseases: www.niams.http://www.myers.net/nih.gov [employer] Aging: https://walker.com/www.nia.nih.gov American College of Rheumatology: www.rheumatology.org Document Released: 04/09/2005  Document Revised: 01/28/2013 Document Reviewed: 12/15/2012 [facility] Patient Information 2014 [place], [place].  Partial Hip Replacement The hip joint attaches the leg to the pelvis. The joint moves and supports you when you walk. The top of the thighbone (femoral head) is in the shape of a ball. Part of the lower pelvis (acetabulum) forms a cup-shaped socket. The ball and socket are attached with bands called ligaments. Smooth tissue (cartilage) lines the surface of the ball and socket to keep the joint cushioned and to help it move. Lubricating fluid (synovial fluid) also covers joint surfaces to keep the joint healthy. A partial hip replacement most often refers to a surgery to replace only the ball part of the joint. A man-made metal material (prosthesis) replaces the worn femoral head. This surgery is often performed to alleviate the pain of arthritis or a break (fracture). It is most commonly performed for a fractured hip. LET YOUR CAREGIVER KNOW ABOUT:   Allergies to food or medicine.  Medicines taken, including vitamins, herbs, eyedrops, over-the-counter medicines, and creams.  Use of steroids (by mouth or creams).  Previous problems with anesthetics or numbing medicines.  History of bleeding problems or blood clots.  Previous surgery.  Other health problems, including diabetes and kidney problems.  Possibility of pregnancy, if this applies. RISKS AND COMPLICATIONS  As with any surgery, complications may occur. However, they can usually be managed by your caregiver. General surgical complications may include:  Reaction to anesthesia.  Damage to surrounding nerves, tissues, or structures.  Infection.  Blood clot.  Bleeding.  Scarring. Complications related to this surgery may include:  Prosthesis not staying attached to the bone.  Joint dislocation.  Continued stiffness or loss of mobility.  Continued pain. BEFORE THE PROCEDURE  It is important to follow your  caregiver's instructions prior to your surgery to avoid complications. Steps before your surgery may include:  Physical exam, blood and urine tests, X-rays, a computerized magnetic scan (MRI), bone scans, or heart tests (cardiogram or chest X-ray).  Your caregiver will review with you the surgery, the anesthesia being used, and what to expect after the surgery. You may be asked to:  Stop taking certain medicines for several days prior to your surgery, such as blood thinners (including aspirin). Your caregiver will let you know which medicines you may continue.  Lose weight.  Seek treatment for any dental conditions.  Avoid eating and drinking at least 8 hours before the surgery. This will help you avoid complications from anesthesia.  Take an antibacterial shower the night before or the morning of the surgery.  Quit smoking, if this applies. Smoking increases the chances of a healing problem after your surgery. If you are thinking about quitting, ask your surgeon how long before the surgery you should stop smoking. Ask your primary caregiver about approaches to help you stop. This can include medicines.  Arrange for someone to help you with activities during recovery. Talk with your caregiver about the need for extended care in a facility. PROCEDURE  The surgery is done after you are given medicine that makes you sleep (general anesthetic) or medicine that makes you numb from the waist down (spinal anesthetic). You will be asleep and will not feel any pain. The surgeon will make a cut (incision) in the hip to access the joint. The top of  the thighbone that contains the ball is removed. The surgeon secures a new prosthetic ball joint into the healthy socket. The nearby bone may grow into the prosthesis to hold it in place, or cement (methacrylate) may be used. The incision is closed with stitches (sutures). The surgery will take several hours to complete. AFTER THE PROCEDURE   You will most  likely be in the hospital for 2 to 4 days following surgery.  You will need to lie in bed and only move as directed.  Caregivers will help you manage pain and swelling with medicines.  You will be asked to perform breathing exercises.  A splint or brace may be applied to the hip.  You will learn specific exercises and how to walk with support. This happens within hours after surgery or the following day. A physical therapist will help you.  You will need to follow your caregiver's instructions for home care after surgery.  Recovery generally takes 3 to 6 months.  You may be asked to limit how often you bend at the waist for 6 weeks. Document Released: 09/27/2009 Document Revised: 07/02/2011 Document Reviewed: 09/27/2009 [facility] Patient Information 2014 [place], [place].

## 2013-08-15 NOTE — ED Provider Notes (Signed)
Medical screening examination/treatment/procedure(s) were performed by non-physician practitioner and as supervising physician I was immediately available for consultation/collaboration.   EKG Interpretation None        Rolland PorterMark Traveon, MD 08/15/13 [id number]

## 2013-11-08 ENCOUNTER — Encounter (HOSPITAL_COMMUNITY): Payer: Self-pay | Admitting: Emergency Medicine

## 2013-11-08 ENCOUNTER — Emergency Department (HOSPITAL_COMMUNITY)
Admission: EM | Admit: 2013-11-08 | Discharge: 2013-11-08 | Disposition: A | Discharge status: Home / Self Care | Payer: Self-pay | Attending: Emergency Medicine | Admitting: Emergency Medicine

## 2013-11-08 DIAGNOSIS — M25559 Pain in unspecified hip: Secondary | ICD-10-CM | POA: Insufficient documentation

## 2013-11-08 DIAGNOSIS — Z79899 Other long term (current) drug therapy: Secondary | ICD-10-CM | POA: Insufficient documentation

## 2013-11-08 DIAGNOSIS — M25551 Pain in right hip: Secondary | ICD-10-CM

## 2013-11-08 DIAGNOSIS — Z880 Allergy status to penicillin: Secondary | ICD-10-CM | POA: Insufficient documentation

## 2013-11-08 DIAGNOSIS — Z791 Long term (current) use of non-steroidal anti-inflammatories (NSAID): Secondary | ICD-10-CM | POA: Insufficient documentation

## 2013-11-08 HISTORY — DX: Pain in unspecified hip: M25.559

## 2013-11-08 MED ORDER — NAPROXEN 500 MG PO TABS: 500.0000 mg | ORAL_TABLET | Freq: Two times a day (BID) | ORAL | Status: DC

## 2013-11-08 MED ORDER — OXYCODONE-ACETAMINOPHEN 5-325 MG PO TABS
2.0000 | ORAL_TABLET | Freq: Once | ORAL | Status: AC
  Administered 2013-11-08: 2 via ORAL
  Filled 2013-11-08: qty 2

## 2013-11-08 MED ORDER — OXYCODONE-ACETAMINOPHEN 5-325 MG PO TABS: 1.0000 | ORAL_TABLET | ORAL | Status: AC | PRN

## 2013-11-08 MED ORDER — TRAMADOL HCL 50 MG PO TABS: 50.0000 mg | ORAL_TABLET | Freq: Four times a day (QID) | ORAL | Status: DC | PRN

## 2013-11-08 NOTE — ED Notes (Signed)
Pt c/o right hip pain x 1 week. Pt seen here in past for same and seen by orthopedic Dr. Rock NephewPt reports that he was referred to [facility] to get surgery.

## 2013-11-08 NOTE — Discharge Instructions (Signed)
Tramadol tablets What is this medicine? TRAMADOL (TRA ma dole) is a pain reliever. It is used to treat moderate to severe pain in adults. This medicine may be used for other purposes; ask your health care provider or pharmacist if you have questions. COMMON BRAND NAME(S): Ultram What should I tell my health care provider before I take this medicine? They need to know if you have any of these conditions: -brain tumor -depression -drug abuse or addiction -head injury -if you frequently drink alcohol containing drinks -kidney disease or trouble passing urine -liver disease -lung disease, asthma, or breathing problems -seizures or epilepsy -suicidal thoughts, plans, or attempt; a previous suicide attempt by you or a family member -an unusual or allergic reaction to tramadol, codeine, other medicines, foods, dyes, or preservatives -pregnant or trying to get pregnant -breast-feeding How should I use this medicine? Take this medicine by mouth with a full glass of water. Follow the directions on the prescription label. If the medicine upsets your stomach, take it with food or milk. Do not take more medicine than you are told to take. Talk to your pediatrician regarding the use of this medicine in children. Special care may be needed. Overdosage: If you think you have taken too much of this medicine contact a poison control center or emergency room at once. NOTE: This medicine is only for you. Do not share this medicine with others. What if I miss a dose? If you miss a dose, take it as soon as you can. If it is almost time for your next dose, take only that dose. Do not take double or extra doses. What may interact with this medicine? Do not take this medicine with any of the following medications: -MAOIs like Carbex, Eldepryl, Marplan, Nardil, and Parnate This medicine may also interact with the following medications: -alcohol or medicines that contain  alcohol -antihistamines -benzodiazepines -bupropion -carbamazepine or oxcarbazepine -clozapine -cyclobenzaprine -digoxin -furazolidone -linezolid -medicines for depression, anxiety, or psychotic disturbances -medicines for migraine headache like almotriptan, eletriptan, frovatriptan, naratriptan, rizatriptan, sumatriptan, zolmitriptan -medicines for pain like pentazocine, buprenorphine, butorphanol, meperidine, nalbuphine, and propoxyphene -medicines for sleep -muscle relaxants -naltrexone -phenobarbital -phenothiazines like perphenazine, thioridazine, chlorpromazine, mesoridazine, fluphenazine, prochlorperazine, promazine, and trifluoperazine -procarbazine -warfarin This list may not describe all possible interactions. Give your health care provider a list of all the medicines, herbs, non-prescription drugs, or dietary supplements you use. Also tell them if you smoke, drink alcohol, or use illegal drugs. Some items may interact with your medicine. What should I watch for while using this medicine? Tell your doctor or health care professional if your pain does not go away, if it gets worse, or if you have new or a different type of pain. You may develop tolerance to the medicine. Tolerance means that you will need a higher dose of the medicine for pain relief. Tolerance is normal and is expected if you take this medicine for a long time. Do not suddenly stop taking your medicine because you may develop a severe reaction. Your body becomes used to the medicine. This does NOT mean you are addicted. Addiction is a behavior related to getting and using a drug for a non-medical reason. If you have pain, you have a medical reason to take pain medicine. Your doctor will tell you how much medicine to take. If your doctor wants you to stop the medicine, the dose will be slowly lowered over time to avoid any side effects. You may get drowsy or dizzy. Do not drive, use  machinery, or do anything that  needs mental alertness until you know how this medicine affects you. Do not stand or sit up quickly, especially if you are an older patient. This reduces the risk of dizzy or fainting spells. Alcohol can increase or decrease the effects of this medicine. Avoid alcoholic drinks. You may have constipation. Try to have a bowel movement at least every 2 to 3 days. If you do not have a bowel movement for 3 days, call your doctor or health care professional. Your mouth may get dry. Chewing sugarless gum or sucking hard candy, and drinking plenty of water may help. Contact your doctor if the problem does not go away or is severe. What side effects may I notice from receiving this medicine? Side effects that you should report to your doctor or health care professional as soon as possible: -allergic reactions like skin rash, itching or hives, swelling of the face, lips, or tongue -breathing difficulties, wheezing -confusion -itching -light headedness or fainting spells -redness, blistering, peeling or loosening of the skin, including inside the mouth -seizures Side effects that usually do not require medical attention (report to your doctor or health care professional if they continue or are bothersome): -constipation -dizziness -drowsiness -headache -nausea, vomiting This list may not describe all possible side effects. Call your doctor for medical advice about side effects. You may report side effects to FDA at 1-800-FDA-1088. Where should I keep my medicine? Keep out of the reach of children. Store at room temperature between 15 and 30 degrees C (59 and 86 degrees F). Keep container tightly closed. Throw away any unused medicine after the expiration date. NOTE: This sheet is a summary. It may not cover all possible information. If you have questions about this medicine, talk to your doctor, pharmacist, or health care provider.  2015, Elsevier/Gold Standard. (2009-12-21  11:55:44) Osteoarthritis Osteoarthritis is a disease that causes soreness and swelling (inflammation) of a joint. It occurs when the cartilage at the affected joint wears down. Cartilage acts as a cushion, covering the ends of bones where they meet to form a joint. Osteoarthritis is the most common form of arthritis. It often occurs in older people. The joints affected most often by this condition include those in the:  Ends of the fingers.  Thumbs.  Neck.  Lower back.  Knees.  Hips. CAUSES  Over time, the cartilage that covers the ends of bones begins to wear away. This causes bone to rub on bone, producing pain and stiffness in the affected joints.  RISK FACTORS Certain factors can increase your chances of having osteoarthritis, including:  Older age.  Excessive body weight.  Overuse of joints. SIGNS AND SYMPTOMS   Pain, swelling, and stiffness in the joint.  Over time, the joint may lose its normal shape.  Small deposits of bone (osteophytes) may grow on the edges of the joint.  Bits of bone or cartilage can break off and float inside the joint space. This may cause more pain and damage. DIAGNOSIS  Your health care provider will do a physical exam and ask about your symptoms. Various tests may be ordered, such as:  X-rays of the affected joint.  An MRI scan.  Blood tests to rule out other types of arthritis.  Joint fluid tests. This involves using a needle to draw fluid from the joint and examining the fluid under a microscope. TREATMENT  Goals of treatment are to control pain and improve joint function. Treatment plans may include:  A prescribed  exercise program that allows for rest and joint relief.  A weight control plan.  Pain relief techniques, such as:  Properly applied heat and cold.  Electric pulses delivered to nerve endings under the skin (transcutaneous electrical nerve stimulation, TENS).  Massage.  Certain nutritional  supplements.  Medicines to control pain, such as:  Acetaminophen.  Nonsteroidal anti-inflammatory drugs (NSAIDs), such as naproxen.  Narcotic or central-acting agents, such as tramadol.  Corticosteroids. These can be given orally or as an injection.  Surgery to reposition the bones and relieve pain (osteotomy) or to remove loose pieces of bone and cartilage. Joint replacement may be needed in advanced states of osteoarthritis. HOME CARE INSTRUCTIONS   Only take over-the-counter or prescription medicines as directed by your health care provider. Take all medicines exactly as instructed.  Maintain a healthy weight. Follow your health care provider's instructions for weight control. This may include dietary instructions.  Exercise as directed. Your health care provider can recommend specific types of exercise. These may include:  Strengthening exercises--These are done to strengthen the muscles that support joints affected by arthritis. They can be performed with weights or with exercise bands to add resistance.  Aerobic activities--These are exercises, such as brisk walking or low-impact aerobics, that get your heart pumping.  Range-of-motion activities--These keep your joints limber.  Balance and agility exercises--These help you maintain daily living skills.  Rest your affected joints as directed by your health care provider.  Follow up with your health care provider as directed. SEEK MEDICAL CARE IF:   Your skin turns red.  You develop a rash in addition to your joint pain.  You have worsening joint pain. SEEK IMMEDIATE MEDICAL CARE IF:  You have a significant loss of weight or appetite.  You have a fever along with joint or muscle aches.  You have night sweats. FOR MORE INFORMATION  National Institute of Arthritis and Musculoskeletal and Skin Diseases: www.niams.http://www.myers.net/ [employer] on Aging: https://walker.com/ American College of Rheumatology:  www.rheumatology.org Document Released: 04/09/2005 Document Revised: 01/28/2013 Document Reviewed: 12/15/2012 [facility] Patient Information 2015 [place], [place]. This information is not intended to replace advice given to you by your health care provider. Make sure you discuss any questions you have with your health care provider. Acetaminophen; Oxycodone tablets What is this medicine? ACETAMINOPHEN; OXYCODONE (a set a MEE noe fen; ox i KOE done) is a pain reliever. It is used to treat mild to moderate pain. This medicine may be used for other purposes; ask your health care provider or pharmacist if you have questions. COMMON BRAND NAME(S): Endocet, Magnacet, Narvox, Percocet, Perloxx, Primalev, Primlev, Roxicet, Xolox What should I tell my health care provider before I take this medicine? They need to know if you have any of these conditions: -brain tumor -Crohn's disease, inflammatory bowel disease, or ulcerative colitis -drug abuse or addiction -head injury -heart or circulation problems -if you often drink alcohol -kidney disease or problems going to the bathroom -liver disease -lung disease, asthma, or breathing problems -an unusual or allergic reaction to acetaminophen, oxycodone, other opioid analgesics, other medicines, foods, dyes, or preservatives -pregnant or trying to get pregnant -breast-feeding How should I use this medicine? Take this medicine by mouth with a full glass of water. Follow the directions on the prescription label. Take your medicine at regular intervals. Do not take your medicine more often than directed. Talk to your pediatrician regarding the use of this medicine in children. Special care may be needed. Patients over 63 years old may have  a stronger reaction and need a smaller dose. Overdosage: If you think you have taken too much of this medicine contact a poison control center or emergency room at once. NOTE: This medicine is only for you. Do not share this  medicine with others. What if I miss a dose? If you miss a dose, take it as soon as you can. If it is almost time for your next dose, take only that dose. Do not take double or extra doses. What may interact with this medicine? -alcohol -antihistamines -barbiturates like amobarbital, butalbital, butabarbital, methohexital, pentobarbital, phenobarbital, thiopental, and secobarbital -benztropine -drugs for bladder problems like solifenacin, trospium, oxybutynin, tolterodine, hyoscyamine, and methscopolamine -drugs for breathing problems like ipratropium and tiotropium -drugs for certain stomach or intestine problems like propantheline, homatropine methylbromide, glycopyrrolate, atropine, belladonna, and dicyclomine -general anesthetics like etomidate, ketamine, nitrous oxide, propofol, desflurane, enflurane, halothane, isoflurane, and sevoflurane -medicines for depression, anxiety, or psychotic disturbances -medicines for sleep -muscle relaxants -naltrexone -narcotic medicines (opiates) for pain -phenothiazines like perphenazine, thioridazine, chlorpromazine, mesoridazine, fluphenazine, prochlorperazine, promazine, and trifluoperazine -scopolamine -tramadol -trihexyphenidyl This list may not describe all possible interactions. Give your health care provider a list of all the medicines, herbs, non-prescription drugs, or dietary supplements you use. Also tell them if you smoke, drink alcohol, or use illegal drugs. Some items may interact with your medicine. What should I watch for while using this medicine? Tell your doctor or health care professional if your pain does not go away, if it gets worse, or if you have new or a different type of pain. You may develop tolerance to the medicine. Tolerance means that you will need a higher dose of the medication for pain relief. Tolerance is normal and is expected if you take this medicine for a long time. Do not suddenly stop taking your medicine  because you may develop a severe reaction. Your body becomes used to the medicine. This does NOT mean you are addicted. Addiction is a behavior related to getting and using a drug for a non-medical reason. If you have pain, you have a medical reason to take pain medicine. Your doctor will tell you how much medicine to take. If your doctor wants you to stop the medicine, the dose will be slowly lowered over time to avoid any side effects. You may get drowsy or dizzy. Do not drive, use machinery, or do anything that needs mental alertness until you know how this medicine affects you. Do not stand or sit up quickly, especially if you are an older patient. This reduces the risk of dizzy or fainting spells. Alcohol may interfere with the effect of this medicine. Avoid alcoholic drinks. There are different types of narcotic medicines (opiates) for pain. If you take more than one type at the same time, you may have more side effects. Give your health care provider a list of all medicines you use. Your doctor will tell you how much medicine to take. Do not take more medicine than directed. Call emergency for help if you have problems breathing. The medicine will cause constipation. Try to have a bowel movement at least every 2 to 3 days. If you do not have a bowel movement for 3 days, call your doctor or health care professional. Do not take Tylenol (acetaminophen) or medicines that have acetaminophen with this medicine. Too much acetaminophen can be very dangerous. Many nonprescription medicines contain acetaminophen. Always read the labels carefully to avoid taking more acetaminophen. What side effects may I notice from receiving  this medicine? Side effects that you should report to your doctor or health care professional as soon as possible: -allergic reactions like skin rash, itching or hives, swelling of the face, lips, or tongue -breathing difficulties, wheezing -confusion -light headedness or fainting  spells -severe stomach pain -unusually weak or tired -yellowing of the skin or the whites of the eyes Side effects that usually do not require medical attention (report to your doctor or health care professional if they continue or are bothersome): -dizziness -drowsiness -nausea -vomiting This list may not describe all possible side effects. Call your doctor for medical advice about side effects. You may report side effects to FDA at 1-800-FDA-1088. Where should I keep my medicine? Keep out of the reach of children. This medicine can be abused. Keep your medicine in a safe place to protect it from theft. Do not share this medicine with anyone. Selling or giving away this medicine is dangerous and against the law. Store at room temperature between 20 and 25 degrees C (68 and 77 degrees F). Keep container tightly closed. Protect from light. This medicine may cause accidental overdose and death if it is taken by other adults, children, or pets. Flush any unused medicine down the toilet to reduce the chance of harm. Do not use the medicine after the expiration date. NOTE: This sheet is a summary. It may not cover all possible information. If you have questions about this medicine, talk to your doctor, pharmacist, or health care provider.  2015, Elsevier/Gold Standard. (2012-12-01 13:17:35)

## 2013-11-08 NOTE — ED Notes (Signed)
Pt here for chronic hip pain. States he went to [facility], was given cortisone injection and [facility]. Here for pain meds.

## 2013-11-08 NOTE — ED Provider Notes (Signed)
CSN: [id number]     Arrival date & time 11/08/13  1127 History  This chart was scribed for Arthor Captain, PA-C, non-physician practitioner working with Juliet Rude. Rubin Payor, MD by Nicholos Johns, ED scribe. This patient was seen in room TR08C/TR08C and the patient's care was started at 2:15 PM.    Chief Complaint  Patient presents with  . Hip Pain   The history is provided by the patient and the spouse. No language interpreter was used.   HPI Comments: Jeffrey Vasquez is a 43 y.o. male w/ hx of hip dysplasia and chronic hip pain presents to the Emergency Department complaining of constant right hip pain; newest onset 1 week ago. Worse going from sitting to standing up. Relief when standing still. 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 When laying down, states he feels as if his hip wants to pop out of place. Occasionally takes ibuprofen but to treat HAs not for hip pain; provides no relief to hip pain. Received Percocet at last ED visit which he states provided significant relief. Received a cortisol shot at initial onset of pain by [facility] which he states resolved pain but only temporarily. [facility] has told him that he needs a hip replacement and that they will not see him anymore because there is nothing else they can do for him. States [facility] referred him to an orthopedics office in [place]. Plans on doing something in August to permanently repair the pain. Wife states she called [facility] who told her to bring him to the ED to receive acute pain management.  Past Medical History  Diagnosis Date  . Hip pain    History reviewed. No pertinent past surgical history. No family history on file. History  Substance Use Topics  . Smoking status: Never Smoker   . Smokeless tobacco: Not on file  . Alcohol Use: Yes     Comment: ocassionally    Review of Systems  Constitutional: Negative for fever and chills.  Musculoskeletal: Positive for  arthralgias.  Skin: Negative for rash.  Neurological: Negative for weakness.   Allergies  Penicillins  Home Medications   Prior to Admission medications   Medication Sig Start Date End Date Taking? Authorizing Provider  meloxicam (MOBIC) 7.5 MG tablet Take 2 tablets (15 mg total) by mouth daily. 08/11/13   Junius Finner, PA-C  methocarbamol (ROBAXIN) 500 MG tablet Take 1 tablet (500 mg total) by mouth 2 (two) times daily as needed for muscle spasms. 08/11/13   Junius Finner, PA-C  oxyCODONE-acetaminophen (PERCOCET/ROXICET) 5-325 MG per tablet Take 1-2 tablets by mouth every 6 (six) hours as needed for severe pain. 08/11/13   Junius Finner, PA-C   Triage vitals: BP 115/69  Pulse 78  Temp(Src) 98 F (36.7 C)  Resp 16  Ht 5\' 9"  (1.753 m)  Wt 155 lb (70.308 kg)  BMI 22.88 kg/m2  SpO2 97%  Physical Exam  Nursing note and vitals reviewed. Constitutional: He is oriented to person, place, and time. He appears well-developed and well-nourished. No distress.  HENT:  Head: Normocephalic and atraumatic.  Eyes: Conjunctivae and EOM are normal.  Neck: Neck supple. No tracheal deviation present.  Cardiovascular: Normal rate.   Pulmonary/Chest: Effort normal. No respiratory distress.  Musculoskeletal:       Right hip: He exhibits decreased range of motion, decreased strength, tenderness and bony tenderness.  Spasm around right hip joint. Severe pain with all movement but he can move on his own. Antalgic gait.  Neurological: He is alert and oriented to person, place, and time.  Skin: Skin is warm and dry.  Psychiatric: He has a normal mood and affect. His behavior is normal.    ED Course  Procedures (including critical care time) DIAGNOSTIC STUDIES: Oxygen Saturation is 97% on room air, normal by my interpretation.    COORDINATION OF CARE: At 2:23 PM: Discussed treatment plan with patient which includes pain medication for hip pain. Patient agrees.    Labs Review Labs Reviewed - No  data to display  Imaging Review No results found.   EKG Interpretation None      MDM   Final diagnoses:  Hip pain, acute, right   Patient with acute on chronic hip pain.  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 Patient has pain with all movement of the joint. He saw physicians [facility] who told him he needs a hip replacement, however he is uninsured, unable to enroll for insurance at this time and unable to afford costs. 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 He was given percocet and antiinflammatories last visit which helped. I will discharge the patient with naproxen, tramadol and percocet. i have asked that he keep the percocet for when he has severe  Pain and is unable to control it on the other medications. I have refferd the patient to the [facility] center. He has the number to contace Felicia evans here in the ED about the orange card and affordable care act.  Patient will be discharged and may follow up at community health and wellness.  I personally performed the services described in this documentation, which was scribed in my presence. The recorded information has been reviewed and is accurate.     Arthor Captainbigail Jianna Drabik, PA-C 11/08/13 2122

## 2013-11-09 NOTE — Discharge Planning (Signed)
[facility] Community Liaison, pts information was given to this liaison by a weekend nurse. [facility] orange card information and resources will be sent to the address provided.

## 2013-11-09 NOTE — ED Provider Notes (Signed)
Medical screening examination/treatment/procedure(s) were performed by non-physician practitioner and as supervising physician I was immediately available for consultation/collaboration.   EKG Interpretation None       Jeff Frieden R. Elgar Scoggins, MD 11/09/13 0704 

## 2013-11-12 ENCOUNTER — Encounter: Payer: Self-pay | Admitting: Internal Medicine

## 2013-11-12 ENCOUNTER — Ambulatory Visit: Payer: Self-pay | Attending: Internal Medicine | Admitting: Internal Medicine

## 2013-11-12 VITALS — BP 105/68 | HR 70 | Temp 97.6°F | Resp 18 | Ht 69.0 in | Wt 174.0 lb

## 2013-11-12 DIAGNOSIS — M25559 Pain in unspecified hip: Secondary | ICD-10-CM | POA: Insufficient documentation

## 2013-11-12 DIAGNOSIS — Q6589 Other specified congenital deformities of hip: Secondary | ICD-10-CM

## 2013-11-12 DIAGNOSIS — Z7689 Persons encountering health services in other specified circumstances: Secondary | ICD-10-CM

## 2013-11-12 DIAGNOSIS — Z7189 Other specified counseling: Secondary | ICD-10-CM

## 2013-11-12 DIAGNOSIS — G8929 Other chronic pain: Secondary | ICD-10-CM | POA: Insufficient documentation

## 2013-11-12 MED ORDER — ACETAMINOPHEN-CODEINE #3 300-30 MG PO TABS: 1.0000 | ORAL_TABLET | Freq: Four times a day (QID) | ORAL | Status: AC | PRN

## 2013-11-12 NOTE — Patient Instructions (Signed)

## 2013-11-12 NOTE — Progress Notes (Signed)
Pt here to establish care for chronic right hip pain radiating down to lower leg x 2 years s/p cortisone injections Last injection 2 mnths ago Pt was told per [facility] he needs hip replace for Dyplasia [place] with ambulation  Denies hip injury

## 2013-11-12 NOTE — Progress Notes (Signed)
Patient ID: Jeffrey Vasquez, male   DOB: 1971-01-11, 43 y.o.   MRN: [id number]  NFA:[id number]  [id number]  DOB - [birth year]  CC:  Chief Complaint  Patient presents with  . Hospitalization Follow-up  . Hip Pain       HPI: Jeffrey Vasquez is a 43 y.o. male here today to establish medical care.  Patient reports that he does not have any significant past medical history except for hip dysplasia.  He reports that he has been having hip pain for the past couple of years but reports that it has become progressively worse over the past year.  He states that he was being seen by a orthopedic in th past that gave him one steroid injection 2 months ago but he could not afford to return for more injections.  Patient states that he was told that he will need a hip replacement but he does not have insurance to get the surgery at this moment.     Allergies  Allergen Reactions  . Penicillins Nausea Only   Past Medical History  Diagnosis Date  . Hip pain    Current Outpatient Prescriptions on File Prior to Visit  Medication Sig Dispense Refill  . naproxen (NAPROSYN) 500 MG tablet Take 1 tablet (500 mg total) by mouth 2 (two) times daily with a meal.  30 tablet  0  . oxyCODONE-acetaminophen (PERCOCET) 5-325 MG per tablet Take 1-2 tablets by mouth every 4 (four) hours as needed.  20 tablet  0  . traMADol (ULTRAM) 50 MG tablet Take 1 tablet (50 mg total) by mouth every 6 (six) hours as needed.  30 tablet  0   No current facility-administered medications on file prior to visit.   History reviewed. No pertinent family history. History   Social History  . Marital Status: Significant Other    Spouse Name: N/A    Number of Children: N/A  . Years of Education: N/A   Occupational History  . Not on file.   Social History Main Topics  . Smoking status: Never Smoker   . Smokeless tobacco: Not on file  . Alcohol Use: Yes     Comment: ocassionally  . Drug Use: No  . Sexual Activity: Not on file    Other Topics Concern  . Not on file   Social History Narrative  . No narrative on file   Review of Systems  Constitutional: Negative.   HENT: Negative.   Eyes: Negative.   Respiratory: Positive for shortness of breath (occasional SOB).   Cardiovascular: Positive for chest pain.       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   Genitourinary: Negative.   Musculoskeletal: Positive for joint pain (right hip pain).       Numbness in right foot when sitting   Skin: Negative.   Neurological: Negative.   Endo/Heme/Allergies: Negative.   Psychiatric/Behavioral: The patient is nervous/anxious.       Objective:   Filed Vitals:   11/12/13 1006  BP: 105/68  Pulse: 70  Temp: 97.6 F (36.4 C)  Resp: 18    Physical Exam: Constitutional: Patient appears well-developed and well-nourished. No distress. HENT: Normocephalic, atraumatic, External right and left ear normal. Oropharynx is clear and moist.  Eyes: Conjunctivae and EOM are normal. PERRLA, no scleral icterus. Neck: Normal ROM. Neck supple. No JVD. No tracheal deviation. No thyromegaly. CVS: RRR, S1/S2 +, no murmurs, no gallops, no carotid bruit.  Pulmonary: Effort and breath sounds normal, no  stridor, rhonchi, wheezes, rales.  Abdominal: Soft. BS +, no distension, tenderness, rebound or guarding.  Musculoskeletal: Pain on passive range of motion of right hip. No edema and no tenderness.  Lymphadenopathy: No lymphadenopathy noted, cervical Neuro: Alert. Normal reflexes, muscle tone coordination. No cranial nerve deficit. Skin: Skin is warm and dry. No rash noted. Not diaphoretic. No erythema. No pallor. Psychiatric: Normal mood and affect. Behavior, judgment, thought content normal.  Lab Results  Component Value Date   WBC 8.2 10/30/2011   HGB 13.6 10/30/2011   HCT 38.9* 10/30/2011   MCV 82.1 10/30/2011   PLT 346 10/30/2011   Lab Results  Component Value Date   CREATININE 0.93 10/30/2011    BUN 10 10/30/2011   NA 135 10/30/2011   K 3.6 10/30/2011   CL 101 10/30/2011   CO2 22 10/30/2011    No results found for this basename: HGBA1C   Lipid Panel  No results found for this basename: chol, trig, hdl, cholhdl, vldl, ldlcalc       Assessment and plan:   Fayrene FearingJames was seen today for hospitalization follow-up and hip pain.  Diagnoses and associated orders for this visit:  Hip dysplasia - Ambulatory referral to Pain Clinic - acetaminophen-codeine (TYLENOL #3) 300-30 MG per tablet; Take 1 tablet by mouth every 6 (six) hours as needed for moderate pain.  Encounter to establish care - Lipid panel; Future - Hemoglobin A1c; Future - TSH; Future - CBC; Future - COMPLETE METABOLIC PANEL WITH GFR; Future - PSA; Future    Return for next thursday lab. Once patient gets orange card will place referral for sports medicine for injections.    Holland CommonsKECK, Jackqueline Aquilar, NP-C [facility] [phone] 11/16/2013, 5:56 PM

## 2013-11-19 ENCOUNTER — Other Ambulatory Visit: Payer: Self-pay

## 2014-02-18 ENCOUNTER — Emergency Department (HOSPITAL_COMMUNITY)
Admission: EM | Admit: 2014-02-18 | Discharge: 2014-02-18 | Disposition: A | Discharge status: Home / Self Care | Payer: Self-pay | Attending: Emergency Medicine | Admitting: Emergency Medicine

## 2014-02-18 ENCOUNTER — Emergency Department (HOSPITAL_COMMUNITY): Payer: Self-pay

## 2014-02-18 ENCOUNTER — Encounter (HOSPITAL_COMMUNITY): Payer: Self-pay | Admitting: Emergency Medicine

## 2014-02-18 DIAGNOSIS — Z880 Allergy status to penicillin: Secondary | ICD-10-CM | POA: Insufficient documentation

## 2014-02-18 DIAGNOSIS — M25551 Pain in right hip: Secondary | ICD-10-CM

## 2014-02-18 DIAGNOSIS — Y9389 Activity, other specified: Secondary | ICD-10-CM | POA: Insufficient documentation

## 2014-02-18 DIAGNOSIS — G8929 Other chronic pain: Secondary | ICD-10-CM | POA: Insufficient documentation

## 2014-02-18 DIAGNOSIS — Z791 Long term (current) use of non-steroidal anti-inflammatories (NSAID): Secondary | ICD-10-CM | POA: Insufficient documentation

## 2014-02-18 DIAGNOSIS — S79911A Unspecified injury of right hip, initial encounter: Secondary | ICD-10-CM | POA: Insufficient documentation

## 2014-02-18 DIAGNOSIS — Y9289 Other specified places as the place of occurrence of the external cause: Secondary | ICD-10-CM | POA: Insufficient documentation

## 2014-02-18 DIAGNOSIS — Q6589 Other specified congenital deformities of hip: Secondary | ICD-10-CM | POA: Insufficient documentation

## 2014-02-18 DIAGNOSIS — R52 Pain, unspecified: Secondary | ICD-10-CM

## 2014-02-18 DIAGNOSIS — W06XXXA Fall from bed, initial encounter: Secondary | ICD-10-CM | POA: Insufficient documentation

## 2014-02-18 MED ORDER — NAPROXEN 500 MG PO TABS: 500.0000 mg | ORAL_TABLET | Freq: Two times a day (BID) | ORAL | Status: DC

## 2014-02-18 MED ORDER — KETOROLAC TROMETHAMINE 30 MG/ML IJ SOLN
60.0000 mg | Freq: Once | INTRAMUSCULAR | Status: AC
  Administered 2014-02-18: 60 mg via INTRAMUSCULAR
  Filled 2014-02-18: qty 2

## 2014-02-18 MED ORDER — OXYCODONE-ACETAMINOPHEN 10-325 MG PO TABS: 0.5000 | ORAL_TABLET | ORAL | Status: AC | PRN

## 2014-02-18 MED ORDER — OXYCODONE-ACETAMINOPHEN 5-325 MG PO TABS
2.0000 | ORAL_TABLET | Freq: Once | ORAL | Status: AC
  Administered 2014-02-18: 2 via ORAL
  Filled 2014-02-18: qty 2

## 2014-02-18 MED ORDER — DEXAMETHASONE SODIUM PHOSPHATE 10 MG/ML IJ SOLN
10.0000 mg | Freq: Once | INTRAMUSCULAR | Status: AC
  Administered 2014-02-18: 10 mg via INTRAMUSCULAR
  Filled 2014-02-18: qty 1

## 2014-02-18 NOTE — ED Provider Notes (Signed)
CSN: [id number]     Arrival date & time 02/18/14  [id number] History  This chart was scribed for non-physician practitioner, Arthor CaptainAbigail Carine Nordgren, PA-C, working with Flint MelterElliott L Wentz, MD by Charline BillsEssence Howell, ED Scribe. This patient was seen in room TR10C/TR10C and the patient's care was started at 11:07 AM.   Chief Complaint  Patient presents with  . Hip Pain   The history is provided by the patient. No language interpreter was used.   HPI Comments: Jeffrey Vasquez is a 43 y.o. male, with a h/o hip dysplasia, who presents to the Emergency Department complaining of constant R hip pain onse last night. Pt initially noted pain 1 year ago. Pt states that he fell last night while getting out of bed and landed on his hip. He reports a sharp, shooting pain that radiates into R leg. Pain is exacerbated with walking and eased with sitting. Pt is awaiting surgery but he is currently uninsured. He also reports intermittent abdominal pain that he describes as "someone squeezing my guts" while attempting to defecate. Symptoms are exacerbated after consuming dairy. He denies blood in stool and constipation. He has tried ibuprofen without relief.   Past Medical History  Diagnosis Date  . Hip pain    History reviewed. No pertinent past surgical history. History reviewed. No pertinent family history. History  Substance Use Topics  . Smoking status: Never Smoker   . Smokeless tobacco: Not on file  . Alcohol Use: Yes     Comment: ocassionally    Review of Systems  Gastrointestinal: Positive for abdominal pain. Negative for constipation and blood in stool.  Musculoskeletal: Positive for arthralgias.  All other systems reviewed and are negative.  Allergies  Penicillins  Home Medications   Prior to Admission medications   Medication Sig Start Date End Date Taking? Authorizing Provider  acetaminophen-codeine (TYLENOL #3) 300-30 MG per tablet Take 1 tablet by mouth every 6 (six) hours as needed for moderate pain.  11/12/13   Ambrose FinlandValerie A Keck, NP  naproxen (NAPROSYN) 500 MG tablet Take 1 tablet (500 mg total) by mouth 2 (two) times daily with a meal. 11/08/13   Arthor CaptainAbigail Raelynn Corron, PA-C  oxyCODONE-acetaminophen (PERCOCET) 5-325 MG per tablet Take 1-2 tablets by mouth every 4 (four) hours as needed. 11/08/13   Arthor CaptainAbigail Jissel Slavens, PA-C  traMADol (ULTRAM) 50 MG tablet Take 1 tablet (50 mg total) by mouth every 6 (six) hours as needed. 11/08/13   Arthor CaptainAbigail Irvin Lizama, PA-C   Triage Vitals: BP 119/81  Pulse 72  Temp(Src) 98.3 F (36.8 C) (Oral)  Resp 22  SpO2 98% Physical Exam  Nursing note and vitals reviewed. Constitutional: He is oriented to person, place, and time. He appears well-developed and well-nourished. No distress.  HENT:  Head: Normocephalic and atraumatic.  Eyes: Conjunctivae and EOM are normal.  Neck: Neck supple.  Pulmonary/Chest: Effort normal. No respiratory distress.  Musculoskeletal: Normal range of motion.  R hip: Antalgic gait Tenderness to palpation  Pain with any ROM Full strength and DTR intact  Neurological: He is alert and oriented to person, place, and time.  Skin: Skin is warm and dry.  Psychiatric: He has a normal mood and affect. His behavior is normal.   ED Course  Procedures (including critical care time) DIAGNOSTIC STUDIES: Oxygen Saturation is 98% on RA, normal by my interpretation.    COORDINATION OF CARE: 11:18 AM-Discussed treatment plan which includes medication for pain, steroid injection and anti-inflammatory with pt at bedside and pt agreed to plan.   Labs  Review Labs Reviewed - No data to display  Imaging Review Dg Hip Complete Right  02/18/2014   CLINICAL DATA:  43 year old with chronic right hip pain. Fell with pain right groin region. Initial encounter.  EXAM: RIGHT HIP - COMPLETE 2+ VIEW  COMPARISON:  08/11/2013.  FINDINGS: Right hip dysplasia without fracture or dislocation noted. If the patient had persistent or progressive symptoms, MR imaging may then be  considered.  IMPRESSION: Right hip dysplasia without fracture or dislocation noted.   Electronically Signed   By: Bridgett LarssonSteve  Olson M.D.   On: 02/18/2014 10:35    EKG Interpretation None      MDM   Final diagnoses:  Pain  Hip dysplasia  Hip pain, right    Patient here after fall he has a history of hip dysplasia chronic hip pain. He is in need of surgical repair of the hip however currently unable to afford it. Patient given crutches to assist weightbearing on that hip, IM Decadron and Toradol here. Discharge with pain medication and anti-inflammatories.  I personally performed the services described in this documentation, which was scribed in my presence. The recorded information has been reviewed and is accurate.     Arthor CaptainAbigail Kraig Genis, PA-C 02/18/14 1128

## 2014-02-18 NOTE — ED Notes (Signed)
Pt c/o chronic right hip pain; pt sts fell last night and landed on same side

## 2014-02-18 NOTE — ED Notes (Signed)
Pt demonstrated correct use of crutches. No further questions/concerns.

## 2014-02-18 NOTE — Discharge Instructions (Signed)
Hip Pain Your hip is the joint between your upper legs and your lower pelvis. The bones, cartilage, tendons, and muscles of your hip joint perform a lot of work each day supporting your body weight and allowing you to move around. Hip pain can range from a minor ache to severe pain in one or both of your hips. Pain may be felt on the inside of the hip joint near the groin, or the outside near the buttocks and upper thigh. You may have swelling or stiffness as well.  HOME CARE INSTRUCTIONS   Take medicines only as directed by your health care provider.  Apply ice to the injured area:  Put ice in a plastic bag.  Place a towel between your skin and the bag.  Leave the ice on for 15-20 minutes at a time, 3-4 times a day.  Keep your leg raised (elevated) when possible to lessen swelling.  Avoid activities that cause pain.  Follow specific exercises as directed by your health care provider.  Sleep with a pillow between your legs on your most comfortable side.  Record how often you have hip pain, the location of the pain, and what it feels like. SEEK MEDICAL CARE IF:   You are unable to put weight on your leg.  Your hip is red or swollen or very tender to touch.  Your pain or swelling continues or worsens after 1 week.  You have increasing difficulty walking.  You have a fever. SEEK IMMEDIATE MEDICAL CARE IF:   You have fallen.  You have a sudden increase in pain and swelling in your hip. MAKE SURE YOU:   Understand these instructions.  Will watch your condition.  Will get help right away if you are not doing well or get worse. Document Released: 09/27/2009 Document Revised: 08/24/2013 Document Reviewed: 12/04/2012 [employer] Patient Information 2015 [employer]. This information is not intended to replace advice given to you by your health care provider. Make sure you discuss any questions you have with your health care provider.  

## 2014-02-18 NOTE — ED Provider Notes (Signed)
Medical screening examination/treatment/procedure(s) were performed by non-physician practitioner and as supervising physician I was immediately available for consultation/collaboration.  Flint MelterElliott L Nathin Saran, MD 02/18/14 [phone]

## 2014-02-22 NOTE — Discharge Planning (Signed)
[facility] Specialist  Spoke to patient regarding primary care resources and the [facility]. Orange card application and instructions provided. Resource guide and my contact information also given for any future questions or concerns.

## 2014-03-06 ENCOUNTER — Emergency Department (HOSPITAL_COMMUNITY)
Admission: EM | Admit: 2014-03-06 | Discharge: 2014-03-06 | Disposition: A | Discharge status: Home / Self Care | Payer: No Typology Code available for payment source | Attending: Emergency Medicine | Admitting: Emergency Medicine

## 2014-03-06 ENCOUNTER — Encounter (HOSPITAL_COMMUNITY): Payer: Self-pay | Admitting: Emergency Medicine

## 2014-03-06 ENCOUNTER — Emergency Department (HOSPITAL_COMMUNITY): Payer: No Typology Code available for payment source

## 2014-03-06 DIAGNOSIS — S59901A Unspecified injury of right elbow, initial encounter: Secondary | ICD-10-CM | POA: Diagnosis not present

## 2014-03-06 DIAGNOSIS — Y9389 Activity, other specified: Secondary | ICD-10-CM | POA: Diagnosis not present

## 2014-03-06 DIAGNOSIS — Y998 Other external cause status: Secondary | ICD-10-CM | POA: Diagnosis not present

## 2014-03-06 DIAGNOSIS — Z791 Long term (current) use of non-steroidal anti-inflammatories (NSAID): Secondary | ICD-10-CM | POA: Diagnosis not present

## 2014-03-06 DIAGNOSIS — Z880 Allergy status to penicillin: Secondary | ICD-10-CM | POA: Diagnosis not present

## 2014-03-06 DIAGNOSIS — M25529 Pain in unspecified elbow: Secondary | ICD-10-CM

## 2014-03-06 DIAGNOSIS — Y92410 Unspecified street and highway as the place of occurrence of the external cause: Secondary | ICD-10-CM | POA: Insufficient documentation

## 2014-03-06 MED ORDER — NAPROXEN 500 MG PO TABS
500.0000 mg | ORAL_TABLET | Freq: Once | ORAL | Status: AC
Start: 2014-03-06 — End: 2014-03-06
  Administered 2014-03-06: 500 mg via ORAL
  Filled 2014-03-06: qty 1

## 2014-03-06 MED ORDER — NAPROXEN 500 MG PO TABS: 500.0000 mg | ORAL_TABLET | Freq: Two times a day (BID) | ORAL | Status: DC
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
## 2014-03-07 ENCOUNTER — Encounter (HOSPITAL_COMMUNITY): Payer: Self-pay | Admitting: Physical Medicine and Rehabilitation

## 2014-03-07 ENCOUNTER — Emergency Department (HOSPITAL_COMMUNITY)
Admission: EM | Admit: 2014-03-07 | Discharge: 2014-03-07 | Disposition: A | Discharge status: Home / Self Care | Payer: No Typology Code available for payment source | Attending: Emergency Medicine | Admitting: Emergency Medicine

## 2014-03-07 DIAGNOSIS — G8911 Acute pain due to trauma: Secondary | ICD-10-CM | POA: Insufficient documentation

## 2014-03-07 DIAGNOSIS — Z791 Long term (current) use of non-steroidal anti-inflammatories (NSAID): Secondary | ICD-10-CM | POA: Diagnosis not present

## 2014-03-07 DIAGNOSIS — Z880 Allergy status to penicillin: Secondary | ICD-10-CM | POA: Insufficient documentation

## 2014-03-07 DIAGNOSIS — M549 Dorsalgia, unspecified: Secondary | ICD-10-CM | POA: Diagnosis present

## 2014-03-07 DIAGNOSIS — M545 Low back pain: Secondary | ICD-10-CM

## 2014-03-07 MED ORDER — HYDROCODONE-ACETAMINOPHEN 5-325 MG PO TABS: 1.0000 | ORAL_TABLET | Freq: Four times a day (QID) | ORAL | Status: AC | PRN

## 2014-03-07 MED ORDER — METHOCARBAMOL 500 MG PO TABS: 500.0000 mg | ORAL_TABLET | Freq: Two times a day (BID) | ORAL | Status: AC

## 2014-03-07 MED ORDER — HYDROCODONE-ACETAMINOPHEN 5-325 MG PO TABS
2.0000 | ORAL_TABLET | Freq: Once | ORAL | Status: AC
  Administered 2014-03-07: 2 via ORAL
  Filled 2014-03-07: qty 2

## 2014-03-07 MED ORDER — METHOCARBAMOL 500 MG PO TABS
500.0000 mg | ORAL_TABLET | Freq: Once | ORAL | Status: AC
  Administered 2014-03-07: 500 mg via ORAL
  Filled 2014-03-07: qty 1

## 2014-03-07 NOTE — ED Notes (Signed)
Pt presents to department for evaluation of back pain. [facility]. Now states he woke up this morning with severe back pain. Ambulatory to triage. Pt is alert and oriented x4.

## 2014-03-07 NOTE — Discharge Instructions (Signed)
Followup with orthopedics if symptoms continue. Use conservative methods at home including heat therapy and cold therapy as we discussed. More information on cold therapy is listed below.  It is not recommended to use heat treatment directly after an acute injury.  Take pain medication and muscle relaxer as needed for pain.  Do not drive or operate heavy machinery for 4-6 hours after taking medication.  SEEK IMMEDIATE MEDICAL ATTENTION IF: New numbness, tingling, weakness, or problem with the use of your arms or legs.  Severe back pain not relieved with medications.  Change in bowel or bladder control.  Increasing pain in any areas of the body (such as chest or abdominal pain).  Shortness of breath, dizziness or fainting.  Nausea (feeling sick to your stomach), vomiting, fever, or sweats.  COLD THERAPY DIRECTIONS:  Ice or gel packs can be used to reduce both pain and swelling. Ice is the most helpful within the first 24 to 48 hours after an injury or flareup from overusing a muscle or joint.  Ice is effective, has very few side effects, and is safe for most people to use.   If you expose your skin to cold temperatures for too long or without the proper protection, you can damage your skin or nerves. Watch for signs of skin damage due to cold.   HOME CARE INSTRUCTIONS  Follow these tips to use ice and cold packs safely.  Place a dry or damp towel between the ice and skin. A damp towel will cool the skin more quickly, so you may need to shorten the time that the ice is used.  For a more rapid response, add gentle compression to the ice.  Ice for no more than 10 to 20 minutes at a time. The bonier the area you are icing, the less time it will take to get the benefits of ice.  Check your skin after 5 minutes to make sure there are no signs of a poor response to cold or skin damage.  Rest 20 minutes or more in between uses.  Once your skin is numb, you can end your treatment. You can test numbness  by very lightly touching your skin. The touch should be so light that you do not see the skin dimple from the pressure of your fingertip. When using ice, most people will feel these normal sensations in this order: cold, burning, aching, and numbness.  Do not use ice on someone who cannot communicate their responses to pain, such as small children or people with dementia.   HOW TO MAKE AN ICE PACK  To make an ice pack, do one of the following:  Place crushed ice or a bag of frozen vegetables in a sealable plastic bag. Squeeze out the excess air. Place this bag inside another plastic bag. Slide the bag into a pillowcase or place a damp towel between your skin and the bag.  Mix 3 parts water with 1 part rubbing alcohol. Freeze the mixture in a sealable plastic bag. When you remove the mixture from the freezer, it will be slushy. Squeeze out the excess air. Place this bag inside another plastic bag. Slide the bag into a pillowcase or place a damp towel between your skin and the bag.   SEEK MEDICAL CARE IF:  You develop white spots on your skin. This may give the skin a blotchy (mottled) appearance.  Your skin turns blue or pale.  Your skin becomes waxy or hard.  Your swelling gets worse.  MAKE SURE YOU:  Understand these instructions.  Will watch your condition.  Will get help right away if you are not doing well or get worse.    Chronic Pain Discharge Instructions  Emergency care providers appreciate that many patients coming to us are in severe pain and we wish to address their pain in the safest, most responsible manner.  It is important to recognize however, that the proper treatment of chronic pain differs from that of the pain of injuries and acute illnesses.  Our goal is to provide quality, safe, personalized care and we thank you for giving us the opportunity to serve you. The use of narcotics and related agents for chronic pain syndromes may lead to additional physical and psychological  problems.  Nearly as many people die from prescription narcotics each year as die from car crashes.  Additionally, this risk is increased if such prescriptions are obtained from a variety of sources.  Therefore, only your primary care physician or a pain management specialist is able to safely treat such syndromes with narcotic medications long-term.    Documentation revealing such prescriptions have been sought from multiple sources may prohibit us from providing a refill or different narcotic medication.  Your name may be checked first through the [facility] [place] Controlled Substances Reporting System.  This database is a record of controlled substance medication prescriptions that the patient has received.  This has been established by [facility] in an effort to eliminate the dangerous, and often life threatening, practice of obtaining multiple prescriptions from different medical providers.   If you have a chronic pain syndrome (i.e. chronic headaches, recurrent back or neck pain, dental pain, abdominal or pelvis pain without a specific diagnosis, or neuropathic pain such as fibromyalgia) or recurrent visits for the same condition without an acute diagnosis, you may be treated with non-narcotics and other non-addictive medicines.  Allergic reactions or negative side effects that may be reported by a patient to such medications will not typically lead to the use of a narcotic analgesic or other controlled substance as an alternative.   Patients managing chronic pain with a personal physician should have provisions in place for breakthrough pain.  If you are in crisis, you should call your physician.  If your physician directs you to the emergency department, please have the doctor call and speak to our attending physician concerning your care.   When patients come to the Emergency Department (ED) with acute medical conditions in which the Emergency Department physician feels appropriate to prescribe  narcotic or sedating pain medication, the physician will prescribe these in very limited quantities.  The amount of these medications will last only until you can see your primary care physician in his/her office.  Any patient who returns to the ED seeking refills should expect only non-narcotic pain medications.   In the event of an acute medical condition exists and the emergency physician feels it is necessary that the patient be given a narcotic or sedating medication -  a responsible adult driver should be present in the room prior to the medication being given by the nurse.   Prescriptions for narcotic or sedating medications that have been lost, stolen or expired will not be refilled in the Emergency Department.    Patients who have chronic pain may receive non-narcotic prescriptions until seen by their primary care physician.  It is every patients personal responsibility to maintain active prescriptions with his or her primary care physician or specialist.
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
## 2014-03-16 ENCOUNTER — Other Ambulatory Visit (HOSPITAL_COMMUNITY): Payer: Self-pay | Admitting: Chiropractic Medicine

## 2014-03-16 ENCOUNTER — Ambulatory Visit (HOSPITAL_COMMUNITY)
Admission: RE | Admit: 2014-03-16 | Discharge: 2014-03-16 | Disposition: A | Discharge status: Home / Self Care | Payer: No Typology Code available for payment source | Source: Ambulatory Visit | Attending: Chiropractic Medicine | Admitting: Chiropractic Medicine

## 2014-03-16 DIAGNOSIS — Z043 Encounter for examination and observation following other accident: Secondary | ICD-10-CM | POA: Insufficient documentation

## 2014-03-16 DIAGNOSIS — Z041 Encounter for examination and observation following transport accident: Secondary | ICD-10-CM

## 2014-03-16 DIAGNOSIS — M542 Cervicalgia: Secondary | ICD-10-CM | POA: Insufficient documentation

## 2014-03-16 DIAGNOSIS — M545 Low back pain, unspecified: Secondary | ICD-10-CM

## 2014-03-16 DIAGNOSIS — M25551 Pain in right hip: Secondary | ICD-10-CM | POA: Insufficient documentation

## 2014-03-16 DIAGNOSIS — M546 Pain in thoracic spine: Secondary | ICD-10-CM

## 2014-11-15 ENCOUNTER — Encounter (HOSPITAL_COMMUNITY): Payer: Self-pay | Admitting: Family Medicine

## 2014-11-15 ENCOUNTER — Emergency Department (HOSPITAL_COMMUNITY)
Admission: EM | Admit: 2014-11-15 | Discharge: 2014-11-15 | Disposition: A | Discharge status: Home / Self Care | Payer: Self-pay | Attending: Emergency Medicine | Admitting: Emergency Medicine

## 2014-11-15 DIAGNOSIS — G8929 Other chronic pain: Secondary | ICD-10-CM | POA: Insufficient documentation

## 2014-11-15 DIAGNOSIS — M25551 Pain in right hip: Secondary | ICD-10-CM | POA: Insufficient documentation

## 2014-11-15 DIAGNOSIS — Z880 Allergy status to penicillin: Secondary | ICD-10-CM | POA: Insufficient documentation

## 2014-11-15 DIAGNOSIS — Z79899 Other long term (current) drug therapy: Secondary | ICD-10-CM | POA: Insufficient documentation

## 2014-11-15 DIAGNOSIS — Z791 Long term (current) use of non-steroidal anti-inflammatories (NSAID): Secondary | ICD-10-CM | POA: Insufficient documentation

## 2014-11-15 MED ORDER — TRAMADOL HCL 50 MG PO TABS: 50.0000 mg | ORAL_TABLET | Freq: Four times a day (QID) | ORAL | Status: AC | PRN

## 2014-11-15 MED ORDER — NAPROXEN 500 MG PO TABS: 500.0000 mg | ORAL_TABLET | Freq: Two times a day (BID) | ORAL | Status: AC

## 2014-11-15 MED ORDER — HYDROMORPHONE HCL 1 MG/ML IJ SOLN
1.0000 mg | Freq: Once | INTRAMUSCULAR | Status: AC
  Administered 2014-11-15: 1 mg via INTRAMUSCULAR
  Filled 2014-11-15: qty 1

## 2014-11-15 NOTE — ED Notes (Signed)
Patient has been referred to pain management by [facility], but patient states they haven't called him yet.   (it's been 8 months).

## 2014-11-15 NOTE — Discharge Instructions (Signed)
Naproxen and tramadol for pain. Please follow-up with her primary care doctor or orthopedic specialist to get further referral for pain management.

## 2014-11-15 NOTE — ED Provider Notes (Signed)
CSN: [id number]     Arrival date & time 11/15/14  1149 History   First MD Initiated Contact with Patient 11/15/14 1218     Chief Complaint  Patient presents with  . Hip Pain     (Consider location/radiation/quality/duration/timing/severity/associated sxs/prior Treatment) HPI Jeffrey Vasquez is a 44 y.o. male with hx of right hip dysplasia, presents to ED with complaint of chronic hip pain. Patient states he has had pain for years. States it comes and goes in severity.  Patient was followed by Dr. Rayburn Ma with orthopedics who told him he needed a hip replacement. Patient unable to do that due to no insurance and no finances. Patient states he has also been seen by primary care doctor who referred her to pain management. He states he is unable to get pain management because of insurance status. Patient states he applied for Medicaid. He states he has not had any recent injuries. He denies any fever or chills. No numbness or weakness in the leg. He has pain with walking, movement of the hip. He is taking over-the-counter Tylenol with no improvement in his symptoms.  Past Medical History  Diagnosis Date  . Hip pain    History reviewed. No pertinent past surgical history. Family History  Problem Relation Age of Onset  . Family history unknown: Yes   History  Substance Use Topics  . Smoking status: Never Smoker   . Smokeless tobacco: Not on file  . Alcohol Use: Yes     Comment: ocassionally    Review of Systems  Constitutional: Negative for fever and chills.  Musculoskeletal: Positive for arthralgias. Negative for back pain and neck pain.  Neurological: Negative for weakness and numbness.      Allergies  Penicillins  Home Medications   Prior to Admission medications   Medication Sig Start Date End Date Taking? Authorizing Provider  acetaminophen-codeine (TYLENOL #3) 300-30 MG per tablet Take 1 tablet by mouth every 6 (six) hours as needed for moderate pain. Patient not taking:  Reported on 03/06/2014 11/12/13   Ambrose Finland, NP  HYDROcodone-acetaminophen (NORCO/VICODIN) 5-325 MG per tablet Take 1-2 tablets by mouth every 6 (six) hours as needed. 03/07/14   Heather Laisure, PA-C  methocarbamol (ROBAXIN) 500 MG tablet Take 1 tablet (500 mg total) by mouth 2 (two) times daily. 03/07/14   Santiago Glad, PA-C  naproxen (NAPROSYN) 500 MG tablet Take 1 tablet (500 mg total) by mouth 2 (two) times daily with a meal. 02/18/14   Arthor Captain, PA-C  naproxen (NAPROSYN) 500 MG tablet Take 1 tablet (500 mg total) by mouth 2 (two) times daily. 03/06/14   Courtney Forcucci, PA-C  oxyCODONE-acetaminophen (PERCOCET) 10-325 MG per tablet Take 0.5-1 tablets by mouth every 4 (four) hours as needed for pain. 02/18/14   Arthor Captain, PA-C  oxyCODONE-acetaminophen (PERCOCET) 5-325 MG per tablet Take 1-2 tablets by mouth every 4 (four) hours as needed. Patient not taking: Reported on 03/06/2014 11/08/13   Arthor Captain, PA-C  traMADol (ULTRAM) 50 MG tablet Take 1 tablet (50 mg total) by mouth every 6 (six) hours as needed. Patient not taking: Reported on 03/06/2014 11/08/13   Arthor Captain, PA-C   BP 112/76 mmHg  Pulse 71  Temp(Src) 98 F (36.7 C)  Resp 18  Ht 5\' 9"  (1.753 m)  Wt 176 lb (79.833 kg)  BMI 25.98 kg/m2  SpO2 98% Physical Exam  Constitutional: He is oriented to person, place, and time. He appears well-developed and well-nourished. No distress.  Eyes: Conjunctivae are  normal.  Neck: Neck supple.  Musculoskeletal: He exhibits no edema.  ttp over right hip. Pain with hip flexion, extension, internal and external rotation. DP pulses intact.   Neurological: He is alert and oriented to person, place, and time.  Nursing note and vitals reviewed.   ED Course  Procedures (including critical care time) Labs Review Labs Reviewed - No data to display  Imaging Review No results found.   EKG Interpretation None      MDM   Final diagnoses:  Right hip pain        Patient with chronic right hip pain, patient has right hip dysplasia. No recent injuries. Did not think he needs any further imaging. Prior images reviewed. He is neurovascularly intact. Explained to him that we no longer treat chronic pain with opiates from emergency department unless there is an acute issue. I will give him Dilaudid IM in emergency department. Home with naproxen and tramadol. Follow-up as needed.  Filed Vitals:   11/15/14 1159  BP: 112/76  Pulse: 71  Temp: 98 F (36.7 C)  Resp: 18  Height:  (1.753 m)  Weight: 176 lb (79.833 kg)  SpO2: 98%      Jaynie Crumble, PA-C 11/15/14 1337  Rolland Porter, MD 11/20/14 [phone]

## 2014-11-15 NOTE — ED Notes (Signed)
Pt here for right hip pain. sts chronic and needs surgery.
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
## 2019-12-30 ENCOUNTER — Other Ambulatory Visit: Payer: Self-pay

## 2019-12-30 ENCOUNTER — Emergency Department (HOSPITAL_COMMUNITY)
Admission: EM | Admit: 2019-12-30 | Discharge: 2019-12-31 | Discharge status: Against Medical Advice | Payer: BLUE CROSS/BLUE SHIELD
